# Patient Record
Sex: Female | Born: 2000 | Hispanic: No | Marital: Single | Smoking: Never smoker
Health system: Southern US, Community
[De-identification: ages and names within clinical notes are randomized; demographics above are authoritative.]

## PROBLEM LIST (undated history)

## (undated) ENCOUNTER — Emergency Department (HOSPITAL_BASED_OUTPATIENT_CLINIC_OR_DEPARTMENT_OTHER): Payer: Medicaid Other

## (undated) DIAGNOSIS — T7840XA Allergy, unspecified, initial encounter: Secondary | ICD-10-CM

## (undated) HISTORY — DX: Allergy, unspecified, initial encounter: T78.40XA

---
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
## 2022-01-19 ENCOUNTER — Ambulatory Visit
Admission: RE | Admit: 2022-01-19 | Discharge: 2022-01-19 | Disposition: A | Discharge status: Home / Self Care | Payer: Medicaid Other | Source: Ambulatory Visit | Attending: Physician Assistant | Admitting: Physician Assistant

## 2022-01-19 VITALS — BP 104/64 | HR 73 | Temp 98.1°F | Resp 16

## 2022-01-19 DIAGNOSIS — J209 Acute bronchitis, unspecified: Secondary | ICD-10-CM | POA: Diagnosis not present

## 2022-01-19 MED ORDER — PREDNISONE 20 MG PO TABS: 40.0000 mg | ORAL_TABLET | Freq: Every day | ORAL | 0 refills | Status: AC

## 2022-01-19 NOTE — ED Provider Notes (Signed)
[facility]    CSN: [id number] Arrival date & time: 01/19/22  9622      History   Chief Complaint Chief Complaint  Patient presents with   Cough    Entered by patient    HPI Anita Gutierrez is a 21 y.o. female.   Patient here today for evaluate of cough she has had the last 2 weeks that will not seem to go away.  She reports that cough is occasionally productive.  She has not had any fever.  She denies any nausea, vomiting or diarrhea.  She does not report treatment for symptoms.  The history is provided by the patient.  Cough Associated symptoms: no chills, no ear pain, no eye discharge, no fever, no shortness of breath, no sore throat and no wheezing     Past Medical History:  Diagnosis Date   Allergy     Patient Active Problem List   Diagnosis Date Noted   Irregular menses 01/28/2017   Alopecia 10/09/2016   Rhinitis, allergic 02/02/2015   Seasonal allergies 08/21/2012    History reviewed. No pertinent surgical history.  OB History   No obstetric history on file.      Home Medications    Prior to Admission medications   Medication Sig Start Date End Date Taking? Authorizing Provider  predniSONE (DELTASONE) 20 MG tablet Take 2 tablets (40 mg total) by mouth daily with breakfast for 5 days. 01/19/22 01/24/22 Yes Tomi Bamberger, PA-C  fluticasone (FLONASE) 50 MCG/ACT nasal spray Place 1 spray into both nostrils daily. Patient not taking: Reported on 12/14/2021 06/19/21   Avelino Leeds, DO    Family History History reviewed. No pertinent family history.  Social History Social History   Tobacco Use   Smoking status: Never   Smokeless tobacco: Never  Substance Use Topics   Alcohol use: Never   Drug use: Never     Allergies   Other   Review of Systems Review of Systems  Constitutional:  Negative for chills and fever.  HENT:  Negative for congestion, ear pain and sore throat.   Eyes:  Negative for discharge and redness.   Respiratory:  Positive for cough. Negative for shortness of breath and wheezing.   Gastrointestinal:  Negative for diarrhea, nausea and vomiting.     Physical Exam Triage Vital Signs ED Triage Vitals  Enc Vitals Group     BP      Pulse      Resp      Temp      Temp src      SpO2      Weight      Height      Head Circumference      Peak Flow      Pain Score      Pain Loc      Pain Edu?      Excl. in GC?    No data found.  Updated Vital Signs BP 104/64 (BP Location: Left Arm)   Pulse 73   Temp 98.1 F (36.7 C) (Oral)   Resp 16   LMP 01/12/2022   SpO2 99%      Physical Exam Vitals and nursing note reviewed.  Constitutional:      General: She is not in acute distress.    Appearance: Normal appearance. She is not ill-appearing.  HENT:     Head: Normocephalic and atraumatic.     Nose: Congestion present.  Eyes:     Conjunctiva/sclera: Conjunctivae normal.  Cardiovascular:     Rate and Rhythm: Normal rate and regular rhythm.     Heart sounds: Normal heart sounds. No murmur heard. Pulmonary:     Effort: Pulmonary effort is normal. No respiratory distress.     Breath sounds: Normal breath sounds. No wheezing, rhonchi or rales.  Skin:    General: Skin is warm and dry.  Neurological:     Mental Status: She is alert.  Psychiatric:        Mood and Affect: Mood normal.        Thought Content: Thought content normal.      UC Treatments / Results  Labs (all labs ordered are listed, but only abnormal results are displayed) Labs Reviewed - No data to display  EKG   Radiology No results found.  Procedures Procedures (including critical care time)  Medications Ordered in UC Medications - No data to display  Initial Impression / Assessment and Plan / UC Course  I have reviewed the triage vital signs and the nursing notes.  Pertinent labs & imaging results that were available during my care of the patient were reviewed by me and considered in my medical  decision making (see chart for details).    Steroid burst prescribed to cover bronchitis.  Recommended follow-up if no gradual improvement or with any further concerns.  Final Clinical Impressions(s) / UC Diagnoses   Final diagnoses:  Acute bronchitis, unspecified organism   Discharge Instructions   None    ED Prescriptions     Medication Sig Dispense Auth. Provider   predniSONE (DELTASONE) 20 MG tablet Take 2 tablets (40 mg total) by mouth daily with breakfast for 5 days. 10 tablet Francene Finders, PA-C      PDMP not reviewed this encounter.   Francene Finders, PA-C 01/19/22 1050

## 2022-01-19 NOTE — ED Triage Notes (Signed)
Pt c/o cough that won't go away.  Started: two weeks ago   Home interventions: none

## 2022-01-25 ENCOUNTER — Ambulatory Visit (INDEPENDENT_AMBULATORY_CARE_PROVIDER_SITE_OTHER): Payer: Medicaid Other | Admitting: Pediatrics

## 2022-01-25 ENCOUNTER — Encounter: Payer: Self-pay | Admitting: Pediatrics

## 2022-01-25 VITALS — Temp 97.7°F | Wt 138.6 lb

## 2022-01-25 DIAGNOSIS — M545 Low back pain, unspecified: Secondary | ICD-10-CM

## 2022-01-25 DIAGNOSIS — K5900 Constipation, unspecified: Secondary | ICD-10-CM

## 2022-01-25 LAB — POCT URINALYSIS DIPSTICK
Bilirubin, UA: NEGATIVE
Blood, UA: NEGATIVE
Glucose, UA: NEGATIVE
Ketones, UA: NEGATIVE
Leukocytes, UA: NEGATIVE
Nitrite, UA: NEGATIVE
Protein, UA: POSITIVE — AB
Spec Grav, UA: 1.01 (ref 1.010–1.025)
Urobilinogen, UA: NEGATIVE E.U./dL — AB
pH, UA: 7.5 (ref 5.0–8.0)

## 2022-01-25 MED ORDER — POLYETHYLENE GLYCOL 3350 17 GM/SCOOP PO POWD: 17.0000 g | Freq: Every day | ORAL | 3 refills | Status: DC

## 2022-01-25 NOTE — Patient Instructions (Addendum)
Thank you for coming in for your visit today. It's great to see you taking charge of your health and addressing your concerns. Based on our discussion, I have provided a list of instructions for you to follow:  1. Labs and Exams: Urine sample to check for any signs of infection. Our nurse will give you a cup to collect the sample.  2. Behavior Changes: Increase your fiber intake and continue drinking plenty of water. Be mindful of your bathroom habits and try not to suppress the urge to go.  3. Medication: I have prescribed Miralax (polyethylene glycol) as a stool softener. Take one capful once a day for the next two weeks. If your stool becomes too soft, reduce the dose to half a capful or take it once every other day.  4. Over-the-counter options: If needed, you can also try over-the-counter laxatives like Docalax to help with constipation.  5. Follow-ups: If your symptoms do not improve after two to three weeks of following the above instructions, please schedule a follow-up appointment with [place].  6. Handouts: Our nurse will provide you with information on managing constipation and preventing UTIs.  Once again, thank you for your visit, and congratulations on taking this important step for your health. If you have any questions or concerns, please do not hesitate to contact our office.  Sincerely,  Dr. [facility]

## 2022-01-25 NOTE — Progress Notes (Unsigned)
Subjective:    Anita Gutierrez is a 21 y.o. old female here for Back Pain (X1 weeks/In the morning when pt wokes up she has back pain) .    Interpreter present: None.    HPI  The patient presents with a chief complaint of back pain, particularly in the mornings, lasting for about two to three hours. The pain is described as deep and radiating around the lower back. The patient reports that the pain is severe, rating it as an 8 out of 10, but does not impede walking. The patient has been experiencing this issue for one week.  The patient has a history of urinary tract infection (UTI) approximately four to five months ago and is concerned about a possible recurrence due to the presence of discolored, brownish urine in the mornings. The patient denies any current sexual activity and reports a high water intake.  The patient also reports recent constipation, with a decrease in bowel movement frequency and hard stool texture. The patient has not taken any laxatives for this issue. The patient is currently attending college and recently started working at [employer]. The patient's last menstrual period was on the 28th, and they typically experience back pain during menstruation.  Patient Active Problem List   Diagnosis Date Noted   Irregular menses 01/28/2017   Alopecia 10/09/2016   Rhinitis, allergic 02/02/2015   Seasonal allergies 08/21/2012    PE up to date?:yes   History and Problem List: Anita Gutierrez has Seasonal allergies; Rhinitis, allergic; Alopecia; and Irregular menses on their problem list.  Earnest  has a past medical history of Allergy.  Immunizations needed: none     Objective:    Temp 97.7 F (36.5 C) (Oral)   Wt 138 lb 9.6 oz (62.9 kg)   LMP 01/12/2022   BMI 24.22 kg/m   Physical Exam Vitals reviewed.  Constitutional:      Appearance: Normal appearance.  Musculoskeletal:        General: No swelling or deformity. Normal range of motion.     Cervical back: Normal and normal  range of motion. No rigidity or tenderness.     Thoracic back: Normal. No swelling, tenderness or bony tenderness. Normal range of motion.     Lumbar back: Normal. No swelling, spasms, tenderness or bony tenderness. No scoliosis.  Neurological:     Mental Status: She is alert.   Results for orders placed or performed in visit on 01/25/22 (from the past 24 hour(s))  POCT urinalysis dipstick     Status: Abnormal   Collection Time: 01/25/22 12:04 PM  Result Value Ref Range   Color, UA yellow    Clarity, UA clear    Glucose, UA Negative Negative   Bilirubin, UA neg    Ketones, UA neg    Spec Grav, UA 1.010 1.010 - 1.025   Blood, UA neg    pH, UA 7.5 5.0 - 8.0   Protein, UA Positive (A) Negative   Urobilinogen, UA negative (A) 0.2 or 1.0 E.U./dL   Nitrite, UA neg    Leukocytes, UA Negative Negative   Appearance     Odor           Assessment and Plan:     Anita Gutierrez was seen today for Back Pain (X1 weeks/In the morning when pt wokes up she has back pain) .   Problem List Items Addressed This Visit   None Visit Diagnoses     Low back pain, unspecified back pain laterality, unspecified chronicity, unspecified whether  sciatica present    -  Primary   Relevant Orders   POCT urinalysis dipstick (Completed)   Constipation, unspecified constipation type            1. Lower back pain    No red flag symptoms and normal physical exam.  Possible muscle strain vs constipation.  Will monitor symptoms for the next week.     - negative Urinalysis, UTI unlikely and patient reassured.  Follow up in one week to repeat urinalysis to evaluate proteinuria and constipation      2. Constipation:    - Encouraged increased fiber intake and hydration.    - Prescribed Miralax (polyethylene glycol) for daily use, with a starting dose of one capful once a day. Adjust the dose as needed based on stool consistency.    - Recommended over-the-counter stool softeners such as Docalax if needed.    -  Reassess constipation and response to treatment in two to three weeks.  3. Stress management:    - Encourage the patient to address potential stressors related to college and work.    - Recommend relaxation techniques and stress management strategies, such as deep breathing exercises, mindfulness, and time management.  4. Follow-up:    - Schedule a follow-up appointment in two to three weeks to evaluate the patient's response to treatment and address any ongoing concerns.    - Instruct the patient to contact the clinic if symptoms worsen or new symptoms arise before the scheduled follow-up appointment.  Return in about 2 weeks (around 02/08/2022) for recheck urine, follow back pain and constipation .  Darrall Dears, MD

## 2022-02-15 ENCOUNTER — Ambulatory Visit (INDEPENDENT_AMBULATORY_CARE_PROVIDER_SITE_OTHER): Payer: Medicaid Other | Admitting: Pediatrics

## 2022-02-15 ENCOUNTER — Encounter: Payer: Self-pay | Admitting: Pediatrics

## 2022-02-15 VITALS — Temp 98.2°F | Wt 136.4 lb

## 2022-02-15 DIAGNOSIS — K5900 Constipation, unspecified: Secondary | ICD-10-CM

## 2022-02-15 DIAGNOSIS — Z09 Encounter for follow-up examination after completed treatment for conditions other than malignant neoplasm: Secondary | ICD-10-CM | POA: Diagnosis not present

## 2022-02-15 DIAGNOSIS — R808 Other proteinuria: Secondary | ICD-10-CM | POA: Diagnosis not present

## 2022-02-15 LAB — POCT URINALYSIS DIPSTICK
Bilirubin, UA: NEGATIVE
Blood, UA: 250
Glucose, UA: NEGATIVE
Ketones, UA: NEGATIVE
Leukocytes, UA: NEGATIVE
Nitrite, UA: NEGATIVE
Protein, UA: NEGATIVE
Spec Grav, UA: 1.01 (ref 1.010–1.025)
Urobilinogen, UA: NEGATIVE E.U./dL — AB
pH, UA: 8 (ref 5.0–8.0)

## 2022-02-15 NOTE — Progress Notes (Signed)
Subjective:    Anita Gutierrez is a 21 y.o. old female here for Follow-up .     HPI  The patient presents primarily to follow up on abnormal urine from previous visit. Concerned that she previously had protein in her urine, but states that she is not currently experiencing any symptoms related to this issue. The patient also reports a history of constipation, which has been an ongoing concern. She describes having watery stools while taking Miralax, but stopped taking it when her stools returned to normal. The patient admits to not taking the Miralax consistently, sometimes skipping days depending on how she felt.  The patient has a reported history of hemorrhoids, (was seen in ED for rectal bleeding in August last year) which were previously bloody but are now only painful. She describes a burning sensation during bowel movements, even when the stool is watery. The patient reports that her stools typically range from type 3 to 4 on the [facility] Stool Scale. She experienced painful bowel movements for a month, but the pain has not been consistent for the past week and a half. The patient's weight has remained stable, and she denies any current blood in her stool.  The patient has been sexually active 8-9 months ago, engaging in vaginal sex only, with no history of anal sex or sexually transmitted diseases. She reports that the area around her rectum is still sensitive.  Currently on menses.   Patient Active Problem List   Diagnosis Date Noted   Irregular menses 01/28/2017   Alopecia 10/09/2016   Rhinitis, allergic 02/02/2015   Seasonal allergies 08/21/2012    History and Problem List: Anita Gutierrez has Seasonal allergies; Rhinitis, allergic; Alopecia; and Irregular menses on their problem list.  Anita Gutierrez  has a past medical history of Allergy.     Objective:    Temp 98.2 F (36.8 C) (Oral)   Wt 136 lb 6.4 oz (61.9 kg)   LMP 01/12/2022   BMI 23.84 kg/m    General Appearance:   alert, oriented, no  acute distress  GI:    Normal appearance of anal area, no fissures, bleeding, external hemorrhoids.   Skin/Hair/Nails:   skin warm and dry; no bruises, no rashes, no lesions    Urinalysis    Component Value Date/Time   BILIRUBINUR neg 02/15/2022 1147   KETONESUR negative 06/05/2021 1430   PROTEINUR Negative 02/15/2022 1147   UROBILINOGEN negative (A) 02/15/2022 1147   NITRITE neg 02/15/2022 1147   LEUKOCYTESUR Negative 02/15/2022 1147        Assessment and Plan:     Anita Gutierrez was seen today for Follow-up .   Problem List Items Addressed This Visit   None Visit Diagnoses     Other proteinuria    -  Primary   Relevant Orders   POCT urinalysis dipstick (Completed)   Follow-up exam       Dyschezia           1. Proteinuria: - The patient's recent urine test showed no protein, indicating resolution of the previous proteinuria. No further action is needed at this time.  2. Constipation: - The patient reports varying stool consistency and occasional painful bowel movements. The patient has been using Miralax inconsistently. - Plan: Encourage the patient to increase dietary fiber intake through whole foods, such as fruits, vegetables, whole grains, and legumes. Advise the patient to use Miralax consistently, adjusting the dose as needed to maintain a soft stool consistency. Recommend drinking plenty of water and establishing regular bowel habits.  Reassess the patient's progress in a month to a month and a half, and consider referral to Gastroenterology if symptoms persist.  3. Anal Pain: - The patient has a history of rectal bleeding thought due to hemorrhoids, but the visualization of the anal area today showed no current fissures or external hemorrhoids. The patient reports only occasional pain and currently no blood in the stool. - Plan: Manage the patient's constipation as described above to reduce straining and prevent the recurrence of hemorrhoids. Monitor for any worsening of  symptoms or blood in the stool, and consider further evaluation if necessary specifically GI referral..   Return precautions reviewed. Severe rectal pain or bleeding.    Return if symptoms worsen or fail to improve.  Darrall Dears, MD

## 2022-02-22 ENCOUNTER — Encounter: Payer: Self-pay | Admitting: Pediatrics

## 2022-03-19 ENCOUNTER — Other Ambulatory Visit: Payer: Self-pay | Admitting: Pediatrics

## 2022-06-17 DIAGNOSIS — L308 Other specified dermatitis: Secondary | ICD-10-CM | POA: Diagnosis not present

## 2022-06-17 DIAGNOSIS — L639 Alopecia areata, unspecified: Secondary | ICD-10-CM | POA: Diagnosis not present

## 2022-07-29 DIAGNOSIS — L639 Alopecia areata, unspecified: Secondary | ICD-10-CM | POA: Diagnosis not present

## 2022-07-29 DIAGNOSIS — L700 Acne vulgaris: Secondary | ICD-10-CM | POA: Diagnosis not present

## 2022-08-08 ENCOUNTER — Other Ambulatory Visit: Payer: Self-pay

## 2022-08-08 ENCOUNTER — Ambulatory Visit (INDEPENDENT_AMBULATORY_CARE_PROVIDER_SITE_OTHER): Payer: Medicaid Other | Admitting: Pediatrics

## 2022-08-08 ENCOUNTER — Encounter: Payer: Self-pay | Admitting: Pediatrics

## 2022-08-08 VITALS — HR 87 | Temp 98.0°F | Wt 135.2 lb

## 2022-08-08 DIAGNOSIS — R350 Frequency of micturition: Secondary | ICD-10-CM | POA: Insufficient documentation

## 2022-08-08 LAB — POCT URINALYSIS DIPSTICK
Bilirubin, UA: NEGATIVE
Blood, UA: NEGATIVE
Glucose, UA: NEGATIVE
Ketones, UA: NEGATIVE
Nitrite, UA: NEGATIVE
Protein, UA: NEGATIVE
Spec Grav, UA: 1.02 (ref 1.010–1.025)
Urobilinogen, UA: 1 E.U./dL
pH, UA: 6 (ref 5.0–8.0)

## 2022-08-08 NOTE — Assessment & Plan Note (Addendum)
Well-appearing, vitals stable. POC UA not reflective of UTI. Discussed with patient that constipation may be playing a role. Pending results for wet prep and urine culture.   Further, given list of PCPs to contact for transition to adult care practice.

## 2022-08-08 NOTE — Progress Notes (Addendum)
  SUBJECTIVE:   CHIEF COMPLAINT / HPI:   Patient presents with odor in urine, darkened urine, urgency, frequency x ~2 weeks. She has had a UTI before but notes these symptoms are not as bad as her previous UTI. That was treated with Bactrim. Denies sexual activity in the last year. No vaginal discharge. She does get regular periods, LMP ~10 days ago. She does endorse regular difficulty with constipation because she does not enjoy taking Miralax.  Denies dysuria, fever, flank pain. Not taken any medications. Denies any different foods.   PERTINENT  PMH / PSH: allergies, allergic rhinitis  Patient Care Team: Jonetta Osgood, MD as PCP - General (Pediatrics) Jonetta Osgood, MD (Pediatrics) OBJECTIVE:  Pulse 87   Temp 98 F (36.7 C) (Oral)   Wt 135 lb 3.2 oz (61.3 kg)   SpO2 100%   BMI 23.63 kg/m  Physical Exam Constitutional:      General: She is not in acute distress.    Appearance: Normal appearance. She is not ill-appearing.  Cardiovascular:     Rate and Rhythm: Normal rate and regular rhythm.     Heart sounds: Normal heart sounds.  Abdominal:     General: Abdomen is flat.     Palpations: Abdomen is soft.     Comments: No suprapubic tenderness  Musculoskeletal:     Comments: No CVA tenderness  Neurological:     Mental Status: She is alert.    Results for orders placed or performed in visit on 08/08/22 (from the past 24 hour(s))  POCT urinalysis dipstick     Status: Abnormal   Collection Time: 08/08/22  3:04 PM  Result Value Ref Range   Color, UA dark yellow    Clarity, UA clear    Glucose, UA Negative Negative   Bilirubin, UA negative    Ketones, UA negative    Spec Grav, UA 1.020 1.010 - 1.025   Blood, UA negative    pH, UA 6.0 5.0 - 8.0   Protein, UA Negative Negative   Urobilinogen, UA 1.0 0.2 or 1.0 E.U./dL   Nitrite, UA negative    Leukocytes, UA Trace (A) Negative   Appearance     Odor      ASSESSMENT/PLAN:  Urinary frequency Assessment &  Plan: Well-appearing, vitals stable. POC UA not reflective of UTI. Discussed with patient that constipation may be playing a role. Pending results for wet prep and urine culture.   Further, given list of PCPs to contact for transition to adult care practice.   Orders: -     POCT urinalysis dipstick -     Urine Culture -     C. trachomatis/N. gonorrhoeae RNA -     WET PREP BY MOLECULAR PROBE  Return if symptoms worsen or fail to improve. Shelby Mattocks, DO 08/08/2022, 3:35 PM PGY-2

## 2022-08-08 NOTE — Patient Instructions (Addendum)
Constipation and tight clothing may cause this urinary urgency you have been experiencing. Your point of care testing was negative for UTI. We submitted a culture. We are also testing for STDs.  Please see below for the adult primary care clinics.    Adult Primary Care Clinics Name Criteria Services   [facility]  Address: [address] [place], [place] [address]  Phone: [phone] Hours: Monday - Friday 9 AM -6 PM  Types of insurance accepted:  Commercial insurance [place] [employer] (orange card) [employer] Uninsured  Language services:  Video and phone interpreters available   Ages 60 and older    Adult primary care Onsite pharmacy Integrated behavioral health Financial assistance counseling Walk-in hours for established patients  Financial assistance counseling hours: Tuesdays 2:00PM - 5:00PM  Thursday 8:30AM - 4:30PM  Space is limited, 10 on Tuesday and 20 on Thursday. It's on first come first serve basis  Name Criteria Services   [facility]  Address: [address] [place], [place] [address]  Phone: [phone]  Hours: Monday - Friday 8:30 AM - 5 PM  Types of insurance accepted:  Commercial insurance Medicaid Medicare Uninsured  Language services:  Video and phone interpreters available   All ages - newborn to adult   Primary care for all ages (children and adults) Integrated behavioral health Nutritionist Financial assistance counseling   Name Criteria Services   [facility]  Located on the ground floor of [facility]  Address: [address]  [place],  [place]  [address]  Phone: [phone]  Hours: Monday - Friday 8:15 AM - 5 PM  Types of insurance accepted:  Commercial insurance Medicaid Medicare Uninsured  Language services:  Video and phone interpreters available   Ages 62 and older   Adult primary  care Nutritionist Certified Diabetes Educator  Integrated behavioral health Financial assistance counseling   Name Criteria Services   [facility]  Address: [address] [place], [place] [address]  Phone: [phone]  Hours: Monday - Friday 8:30 AM - 5 PM    Types of insurance accepted:  Nurse, learning disability Medicaid Medicare Uninsured  Language services:  Video and phone interpreters available   All ages - newborn to adult   Primary care for all ages (children and adults) Integrated behavioral health Financial assistance counseling

## 2022-08-09 LAB — URINE CULTURE
MICRO NUMBER:: 14995688
Result:: NO GROWTH
SPECIMEN QUALITY:: ADEQUATE

## 2022-08-12 ENCOUNTER — Encounter: Payer: Self-pay | Admitting: Pediatrics

## 2022-08-12 LAB — C. TRACHOMATIS/N. GONORRHOEAE RNA

## 2022-08-12 LAB — WET PREP BY MOLECULAR PROBE
Candida species: NOT DETECTED
Gardnerella vaginalis: NOT DETECTED
MICRO NUMBER:: 14995689
SPECIMEN QUALITY:: ADEQUATE
Trichomonas vaginosis: NOT DETECTED

## 2022-08-26 ENCOUNTER — Encounter: Payer: Self-pay | Admitting: Family Medicine

## 2022-08-26 ENCOUNTER — Ambulatory Visit (INDEPENDENT_AMBULATORY_CARE_PROVIDER_SITE_OTHER): Payer: Medicaid Other | Admitting: Family Medicine

## 2022-08-26 VITALS — BP 105/61 | HR 70 | Ht 63.0 in | Wt 133.4 lb

## 2022-08-26 DIAGNOSIS — Z021 Encounter for pre-employment examination: Secondary | ICD-10-CM

## 2022-08-26 DIAGNOSIS — Z1159 Encounter for screening for other viral diseases: Secondary | ICD-10-CM

## 2022-08-26 DIAGNOSIS — Z23 Encounter for immunization: Secondary | ICD-10-CM

## 2022-08-26 DIAGNOSIS — Z0000 Encounter for general adult medical examination without abnormal findings: Secondary | ICD-10-CM

## 2022-08-26 DIAGNOSIS — Z114 Encounter for screening for human immunodeficiency virus [HIV]: Secondary | ICD-10-CM | POA: Diagnosis not present

## 2022-08-26 NOTE — Progress Notes (Signed)
New Patient Visit  HPI:  Patient presents today for a new patient appointment to establish general primary care.  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  Past Medical Hx:  - Environmental allergies  - Irregular menses  - Alopecia areata   Past Surgical Hx:  -None  Family Hx: updated in Epic  Social Hx:  - occupation: nursing school  - highest level of education: In college  - lives with: Mom dad sister brother  - tobacco: None  - alcohol: None  - drugs:None - Not sexually active  Health Maintenance:  -Pap indicated starting next year - No first-degree relative history of breast cancer, no mammogram indicated at this time.  PHYSICAL EXAM: BP 105/61   Pulse 70   Ht 5\' 3"  (1.6 m)   Wt 133 lb 6 oz (60.5 kg)   LMP 08/25/2022   SpO2 100%   BMI 23.63 kg/m  General: well appearing, in no acute distress CV: RRR, radial pulses equal and palpable, no BLE edema  Resp: Normal work of breathing on room air, CTAB Abd: Soft, non tender, non distended  Neuro: Alert & Oriented x 4    ASSESSMENT/PLAN:  Health maintenance:  -HIV, hep C screening test ordered -Tdap vaccine given  Encounter for health exam secondary to work/schooling  - Hep B surface antibody ordered - MMR IgG titer ordered - QuantiFERON gold ordered   FOLLOW UP: Follow up in 1 year for annual physical and Pap  Lockie Mola, MD [facility]

## 2022-08-26 NOTE — Patient Instructions (Signed)
It was wonderful to see you today.  Please bring ALL of your medications with you to every visit.   I will let you know the results of all your labs!   Please follow up in one year or as needed.   Thank you for choosing [facility].   Please call [phone] with any questions about today's appointment.   Lockie Mola, MD  Family Medicine

## 2022-08-27 LAB — QUANTIFERON-TB GOLD PLUS

## 2022-08-27 LAB — HIV ANTIBODY (ROUTINE TESTING W REFLEX): HIV Screen 4th Generation wRfx: NONREACTIVE

## 2022-08-29 LAB — HCV AB W REFLEX TO QUANT PCR: HCV Ab: NONREACTIVE

## 2022-08-29 LAB — QUANTIFERON-TB GOLD PLUS
QuantiFERON Mitogen Value: >10 IU/mL
QuantiFERON Nil Value: 0.01 IU/mL
QuantiFERON TB1 Ag Value: 0.01 IU/mL
QuantiFERON TB2 Ag Value: 0.01 IU/mL
QuantiFERON-TB Gold Plus: NEGATIVE

## 2022-08-29 LAB — MEASLES/MUMPS/RUBELLA IMMUNITY
MUMPS ABS, IGG: 58.6 AU/mL (ref >10.9)
RUBEOLA AB, IGG: 221 AU/mL (ref >16.4)
Rubella Antibodies, IGG: 3.99 index (ref >0.99)

## 2022-08-29 LAB — HCV INTERPRETATION

## 2022-08-29 LAB — HEPATITIS B CORE AB W/REFLEX: Hep B Core Total Ab: NEGATIVE

## 2022-09-10 ENCOUNTER — Ambulatory Visit (INDEPENDENT_AMBULATORY_CARE_PROVIDER_SITE_OTHER): Payer: Medicaid Other | Admitting: Family Medicine

## 2022-09-10 ENCOUNTER — Encounter: Payer: Self-pay | Admitting: Family Medicine

## 2022-09-10 VITALS — BP 109/71 | HR 67 | Wt 134.2 lb

## 2022-09-10 DIAGNOSIS — Z0000 Encounter for general adult medical examination without abnormal findings: Secondary | ICD-10-CM | POA: Diagnosis not present

## 2022-09-10 DIAGNOSIS — Z0184 Encounter for antibody response examination: Secondary | ICD-10-CM | POA: Diagnosis not present

## 2022-09-10 DIAGNOSIS — M549 Dorsalgia, unspecified: Secondary | ICD-10-CM

## 2022-09-10 NOTE — Patient Instructions (Signed)
It was nice seeing you today!  Blood work today.  See me in 3 months or whenever is a good for you.  Stay well, Collier Bohnet, MD [facility] [phone]  --  Make sure to check out at the front desk before you leave today.  Please arrive at least 15 minutes prior to your scheduled appointments.  If you had blood work today, I will send you a MyChart message or a letter if results are normal. Otherwise, I will give you a call.  If you had a referral placed, they will call you to set up an appointment. Please give us a call if you don't hear back in the next 2 weeks.  If you need additional refills before your next appointment, please call your pharmacy first.  

## 2022-09-10 NOTE — Progress Notes (Signed)
    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 She needs varicella titers as part of her school forms.  Patient reports that she will on occasion have back discomfort, sometimes feels as if her back is misaligned. She reports this is occasional, minimally bothersome.  Denies smoking, alcohol, recreational drug use. She exercises 2 days a week, 1 to 2 hours at a time doing weights, walking, and running. She lives at home with mother, father, younger brother and younger sister.  PERTINENT  PMH / PSH:   Patient Care Team: Lockie Mola, MD as PCP - General (Family Medicine) Jonetta Osgood, MD (Pediatrics)   OBJECTIVE:   BP 109/71   Pulse 67   Wt 134 lb 4 oz (60.9 kg)   LMP 08/27/2022   SpO2 100%   BMI 23.78 kg/m   Physical Exam Constitutional:      General: She is not in acute distress. HENT:     Head: Normocephalic and atraumatic.     Mouth/Throat:     Mouth: Mucous membranes are moist.     Pharynx: Oropharynx is clear. No oropharyngeal exudate or posterior oropharyngeal erythema.  Eyes:     Extraocular Movements: Extraocular movements intact.     Pupils: Pupils are equal, round, and reactive to light.  Cardiovascular:     Rate and Rhythm: Normal rate and regular rhythm.  Pulmonary:     Effort: Pulmonary effort is normal. No respiratory distress.     Breath sounds: Normal breath sounds.  Abdominal:     Palpations: Abdomen is soft.     Tenderness: There is no abdominal tenderness.  Musculoskeletal:     Cervical back: Neck supple.  Lymphadenopathy:     Cervical: No cervical adenopathy.  Skin:    General: Skin is warm and dry.  Neurological:     Mental Status: She is alert.         09/10/2022    2:38 PM  Depression screen PHQ 2/9  Decreased Interest 0  Down,  Depressed, Hopeless 0  PHQ - 2 Score 0  Altered sleeping 0  Tired, decreased energy 0  Change in appetite 0  Feeling bad or failure about yourself  0  Trouble concentrating 0  Moving slowly or fidgety/restless 0  Suicidal thoughts 0  PHQ-9 Score 0     {Show previous vital signs (optional):23777}    ASSESSMENT/PLAN:   1. Encounter for routine history and physical examination of adult Physical exam form completed and handed to patient during encounter.  Health maintenance handout provided.  2. Immunity to varicella determined by serologic test Obtain varicella titers as required by her school. - Varicella zoster antibody, IgG    3. Discomfort of back Intermittent, seems to be minimally bothersome.  Advised that she can take acetaminophen and/or ibuprofen if pain becomes severe enough.  Discussed exercise and core strengthening.  Advised to follow-up if this becomes a significant problem.  Return in about 1 year (around 09/10/2023) for physical.   Littie Deeds, MD [facility] Medicine [facility]

## 2022-09-12 LAB — VARICELLA ZOSTER ANTIBODY, IGG: Varicella zoster IgG: 173 index (ref >165)

## 2022-09-16 DIAGNOSIS — L639 Alopecia areata, unspecified: Secondary | ICD-10-CM | POA: Diagnosis not present

## 2022-09-25 ENCOUNTER — Ambulatory Visit: Payer: Medicaid Other | Admitting: Pediatrics

## 2022-10-28 DIAGNOSIS — L639 Alopecia areata, unspecified: Secondary | ICD-10-CM | POA: Diagnosis not present

## 2022-12-11 DIAGNOSIS — L639 Alopecia areata, unspecified: Secondary | ICD-10-CM | POA: Diagnosis not present

## 2023-01-29 DIAGNOSIS — L639 Alopecia areata, unspecified: Secondary | ICD-10-CM | POA: Diagnosis not present

## 2023-02-19 ENCOUNTER — Ambulatory Visit: Payer: Medicaid Other | Admitting: Family Medicine

## 2023-02-19 ENCOUNTER — Ambulatory Visit (INDEPENDENT_AMBULATORY_CARE_PROVIDER_SITE_OTHER): Payer: Medicaid Other | Admitting: Family Medicine

## 2023-02-19 VITALS — BP 117/82 | HR 83 | Ht 63.0 in | Wt 135.4 lb

## 2023-02-19 DIAGNOSIS — I7300 Raynaud's syndrome without gangrene: Secondary | ICD-10-CM | POA: Insufficient documentation

## 2023-02-19 NOTE — Progress Notes (Signed)
    SUBJECTIVE:   CHIEF COMPLAINT / HPI: feet numbness  Toe feels numb every day, intermittently. Notices it with colder weather. Takes up to 7min-hour to go way with heating her feet up. Worse with weather recently. Denies fevers, rashes, joint pain. Does not smoke.   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 No symptoms to suggest secondary autoimmune derangement causing symptoms. Counseled on keeping extremity digits warm. Counseled on return precautions for autoimmune symptoms or prolonged symptom course.  Return if symptoms worsen or fail to improve.  Celine Mans, MD [facility]

## 2023-02-19 NOTE — Assessment & Plan Note (Signed)
Likely primary Raynaud's phenomena given age and clinical course. No symptoms to suggest secondary autoimmune derangement causing symptoms. Counseled on keeping extremity digits warm. Counseled on return precautions for autoimmune symptoms or prolonged symptom course.

## 2023-02-19 NOTE — Patient Instructions (Addendum)
It was great to see you! Thank you for allowing me to participate in your care!  Our plans for today:  - You have Raynaud's phenomena, this causes your cold feed and bluetinged feet. Please make sure to keep your feet warm as we discussed.  - If you develop new symptoms such as joint pain, fevers, muscle cramps please make a follow-up appointment.   Please arrive 15 minutes PRIOR to your next scheduled appointment time! If you do not, this affects OTHER patients' care.  Take care and seek immediate care sooner if you develop any concerns.   Celine Mans, MD, PGY-2 [facility] 10:58 AM 02/19/2023  [facility] Family [facility]

## 2023-04-03 ENCOUNTER — Other Ambulatory Visit (HOSPITAL_COMMUNITY)
Admission: RE | Admit: 2023-04-03 | Discharge: 2023-04-03 | Disposition: A | Discharge status: Home / Self Care | Payer: Medicaid Other | Source: Ambulatory Visit | Attending: Family Medicine | Admitting: Family Medicine

## 2023-04-03 ENCOUNTER — Ambulatory Visit (INDEPENDENT_AMBULATORY_CARE_PROVIDER_SITE_OTHER): Payer: Medicaid Other | Admitting: Family Medicine

## 2023-04-03 ENCOUNTER — Encounter: Payer: Self-pay | Admitting: Family Medicine

## 2023-04-03 VITALS — BP 126/84 | HR 83 | Ht 63.0 in | Wt 141.2 lb

## 2023-04-03 DIAGNOSIS — B3731 Acute candidiasis of vulva and vagina: Secondary | ICD-10-CM | POA: Diagnosis not present

## 2023-04-03 DIAGNOSIS — N898 Other specified noninflammatory disorders of vagina: Secondary | ICD-10-CM

## 2023-04-03 LAB — POCT WET PREP (WET MOUNT)
Clue Cells Wet Prep Whiff POC: NEGATIVE
Trichomonas Wet Prep HPF POC: ABSENT

## 2023-04-03 MED ORDER — FLUCONAZOLE 150 MG PO TABS
150.0000 mg | ORAL_TABLET | Freq: Once | ORAL | 0 refills | Status: AC
Start: 2023-04-03 — End: 2023-04-03

## 2023-04-03 NOTE — Progress Notes (Signed)
    SUBJECTIVE:   CHIEF COMPLAINT / HPI:   Vaginal itching Ongoing for the past 4 days. Denies vaginal discharge. Recently shaved with a razor and uses shaving cream that may have irritated the area. Abstinent, low concern for STI and not interested in birth control.  PERTINENT  PMH / PSH: Allergies  OBJECTIVE:   BP 126/84   Pulse 83   Ht 5\' 3"  (1.6 m)   Wt 141 lb 3.2 oz (64 kg)   SpO2 100%   BMI 25.01 kg/m    General: NAD, pleasant, able to participate in exam Pelvic exam: VULVA: normal appearing vulva with no masses, tenderness or lesions, Mild folliculitis, VAGINA: normal appearing vagina with normal color and discharge, no lesions, vaginal discharge - white, WET MOUNT done - results: KOH done, hyphae, exam chaperoned by [place], CMA.  ASSESSMENT/PLAN:   Assessment & Plan Vaginal itching Wet prep suggestive of yeast infection, treatment sent. Swabbed for STIs as well but low risk given celibate. Likely component of folliculitis as well, advised on shaving/irritants. -Fluconazole x1 -G/C and trich       Dr. Elberta Fortis, DO [facility]

## 2023-04-03 NOTE — Patient Instructions (Signed)
It was wonderful to see you today! Thank you for choosing [facility].   Please bring ALL of your medications with you to every visit.   Today we talked about:  We did swabs today for bacterial and yeast vaginal infections.  I also swabbed you for gonorrhea, chlamydia and trichomonas and follow-up with all of those results.  I would recommend avoiding scented products on that area and only wash gently with mild soap and water.  Never place anything inside the vagina.  I would avoid close shaving the next 1 to 2 weeks as well as this can irritate the area further.  In the future he may consider using an Neurosurgeon with a guard so as not to irritate hair follicles with shaving with a razor.  Please follow up as needed for persistent symptoms   We are checking some labs today. If they are abnormal, I will call you. If they are normal, I will send you a MyChart message (if it is active) or a letter in the mail. If you do not hear about your labs in the next 2 weeks, please call the office.  Call the clinic at [phone] if your symptoms worsen or you have any concerns.  Please be sure to schedule follow up at the front desk before you leave today.   Anita Fortis, DO Family Medicine

## 2023-04-04 ENCOUNTER — Encounter: Payer: Self-pay | Admitting: Family Medicine

## 2023-04-04 LAB — CERVICOVAGINAL ANCILLARY ONLY
Chlamydia: NEGATIVE
Comment: NEGATIVE
Comment: NEGATIVE
Comment: NORMAL
Neisseria Gonorrhea: NEGATIVE
Trichomonas: NEGATIVE

## 2023-04-08 ENCOUNTER — Telehealth: Payer: Self-pay

## 2023-04-08 DIAGNOSIS — B3731 Acute candidiasis of vulva and vagina: Secondary | ICD-10-CM

## 2023-04-08 MED ORDER — FLUCONAZOLE 150 MG PO TABS
150.0000 mg | ORAL_TABLET | Freq: Once | ORAL | 0 refills | Status: AC
Start: 2023-04-08 — End: 2023-04-08

## 2023-04-08 NOTE — Telephone Encounter (Signed)
Patient calls nurse line regarding last week's visit. She reports that symptoms have improved, however, reports slight vaginal discomfort/itching and small amount of white discharge.   She had fluconazole x1.   She is asking if she needs to receive another dose to completley treat the yeast infection.   Will forward to Dr. Ardyth Harps.   Veronda Prude, RN

## 2023-05-02 ENCOUNTER — Ambulatory Visit: Payer: Medicaid Other | Admitting: Student

## 2023-05-02 ENCOUNTER — Encounter: Payer: Self-pay | Admitting: Student

## 2023-05-02 VITALS — BP 134/84 | HR 88 | Ht 63.0 in | Wt 139.0 lb

## 2023-05-02 DIAGNOSIS — K649 Unspecified hemorrhoids: Secondary | ICD-10-CM

## 2023-05-02 DIAGNOSIS — K5900 Constipation, unspecified: Secondary | ICD-10-CM

## 2023-05-02 MED ORDER — POLYETHYLENE GLYCOL 3350 17 GM/SCOOP PO POWD: 8.5000 g | Freq: Every day | ORAL | 3 refills | Status: DC

## 2023-05-02 MED ORDER — HYDROCORTISONE (PERIANAL) 2.5 % EX CREA: 1.0000 | TOPICAL_CREAM | Freq: Two times a day (BID) | CUTANEOUS | 2 refills | Status: AC

## 2023-05-02 NOTE — Patient Instructions (Addendum)
Kaleiyah,  Let's start with Miralax 1/2 cap/day. You may need to adjust this dose up or down until you get to 1-2 soft BM/day. You can try am dosing or PM dosing.   If you feel like your anxiety and constipation are getting worse and running together, this is another potential treatment course.   Eliezer Mccoy, MD

## 2023-05-02 NOTE — Progress Notes (Signed)
    SUBJECTIVE:   CHIEF COMPLAINT / HPI:   Constipation Has a longstanding history of constipation.  Previously followed at the [facility] for this.  She tells me that she has regular bowel movements nearly every day but that they are hard and sometimes painful to pass.  She also finds herself often straining and does have hemorrhoids related to this.   She was previously taking MiraLAX, 1 capful per day but found that it worked "too fast" and felt more like a laxative that led to diarrhea. Long discussion today also about the gut-brain axis and potential of some anxiety driving IBS-like symptoms.  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  She does not feel the need to pursue treatment of this anxiety at this point.   OBJECTIVE:   BP 134/84   Pulse 88   Ht 5\' 3"  (1.6 m)   Wt 139 lb (63 kg)   LMP 04/18/2023   SpO2 100%   BMI 24.62 kg/m   General: alert & oriented, no apparent distress, well groomed HEENT: normocephalic, atraumatic, EOM grossly intact, oral mucosa moist, neck supple Respiratory: normal respiratory effort GI: non-distended Skin: no rashes, no jaundice Psych: appropriate mood and affect   ASSESSMENT/PLAN:   Assessment & Plan Constipation, unspecified constipation type Chronic, does have daily bowel movements, though they are hard.  Would benefit from daily stool softener. -Restart MiraLAX, advised her to start at a half a cap per day and titrate to 1-2 soft bowel movements daily Hemorrhoids, unspecified hemorrhoid type -Anusol twice daily as needed for symptoms    J Dorothyann Gibbs, MD [facility] Medicine Center

## 2023-05-02 NOTE — Patient Instructions (Incomplete)
It was great to see you! Thank you for allowing me to participate in your care!  I recommend that you always bring your medications to each appointment as this makes it easy to ensure we are on the correct medications and helps [place] not miss when refills are needed.  Our plans for today:  - Constipation - Loss of appetite  We are checking some labs today, I will call you if they are abnormal will send you a MyChart message or a letter if they are normal.  If you do not hear about your labs in the next 2 weeks please let us know.***  Take care and seek immediate care sooner if you develop any concerns.   Dr. Bess Kinds, MD [facility] Medicine

## 2023-05-02 NOTE — Progress Notes (Deleted)
  SUBJECTIVE:   CHIEF COMPLAINT / HPI:   Constipation and loss of appetite  PERTINENT  PMH / PSH: ***  Past Medical History:  Diagnosis Date   Allergy    OBJECTIVE:  There were no vitals taken for this visit. Physical Exam   ASSESSMENT/PLAN:   Assessment & Plan  No follow-ups on file. Bess Kinds, MD 05/02/2023, 6:43 AM PGY-***, [facility] {    This will disappear when note is signed, click to select method of visit    :1}

## 2023-05-23 DIAGNOSIS — L639 Alopecia areata, unspecified: Secondary | ICD-10-CM | POA: Diagnosis not present

## 2023-05-23 DIAGNOSIS — L309 Dermatitis, unspecified: Secondary | ICD-10-CM | POA: Diagnosis not present

## 2023-05-28 ENCOUNTER — Encounter: Payer: Self-pay | Admitting: Student

## 2023-05-28 DIAGNOSIS — K5900 Constipation, unspecified: Secondary | ICD-10-CM

## 2023-05-29 MED ORDER — POLYETHYLENE GLYCOL 3350 17 GM/SCOOP PO POWD: 17.0000 g | Freq: Every day | ORAL | 3 refills | Status: AC

## 2023-07-01 ENCOUNTER — Ambulatory Visit (HOSPITAL_COMMUNITY)

## 2023-07-16 DIAGNOSIS — L639 Alopecia areata, unspecified: Secondary | ICD-10-CM | POA: Diagnosis not present

## 2023-07-16 DIAGNOSIS — L309 Dermatitis, unspecified: Secondary | ICD-10-CM | POA: Diagnosis not present

## 2023-08-08 ENCOUNTER — Telehealth: Payer: Self-pay

## 2023-08-08 NOTE — Telephone Encounter (Signed)
 Spoke with patient and was able to schedule her a nurse visit due to her form requesting vision and hearing to be completed. Patient explained that she needs this form completed by next Friday. Forwarding this message to PCP since I am not sure who is covering her box. Alain Howard CMA   Marking as a high priority since patient is worried to have this completed before the deadline

## 2023-08-08 NOTE — Telephone Encounter (Signed)
 Patient dropped off physical form needed for school. Patient stated she is needing it by at least next Friday. Placed in red folder.

## 2023-08-12 ENCOUNTER — Ambulatory Visit (INDEPENDENT_AMBULATORY_CARE_PROVIDER_SITE_OTHER)

## 2023-08-12 DIAGNOSIS — Z0110 Encounter for examination of ears and hearing without abnormal findings: Secondary | ICD-10-CM

## 2023-08-12 DIAGNOSIS — Z0100 Encounter for examination of eyes and vision without abnormal findings: Secondary | ICD-10-CM

## 2023-08-12 NOTE — Telephone Encounter (Signed)
 Patient called and informed that forms are ready for pick up. Copy made and placed in batch scanning. Original placed at front desk for pick up.   Veronda Prude, RN

## 2023-08-12 NOTE — Telephone Encounter (Signed)
 Hearing and Vision Screen PASS.  Unsure where the form is to record.   See nursing visit note for additional details.

## 2023-08-12 NOTE — Telephone Encounter (Signed)
 Ok, great! Once your done just forward the message to the RN team and they will call the patient when it is ready for pickup. Thanks! Alain Howard CMA

## 2023-08-12 NOTE — Progress Notes (Signed)
 Patient presents to nurse clinic for hearing and vision screen.  Hearing Screen: Pass Vision Screen: Pass   See admin for details.   She reports her school form needs to be handed in by 5/30. Will forward to covering provider for PCP.

## 2023-09-15 DIAGNOSIS — L639 Alopecia areata, unspecified: Secondary | ICD-10-CM | POA: Diagnosis not present

## 2023-09-16 ENCOUNTER — Encounter: Payer: Self-pay | Admitting: Family Medicine

## 2023-11-07 ENCOUNTER — Ambulatory Visit: Admitting: Family Medicine

## 2023-11-07 ENCOUNTER — Encounter: Payer: Self-pay | Admitting: Family Medicine

## 2023-11-07 VITALS — BP 117/77 | HR 82 | Ht 63.0 in | Wt 136.6 lb

## 2023-11-07 DIAGNOSIS — K5900 Constipation, unspecified: Secondary | ICD-10-CM | POA: Diagnosis not present

## 2023-11-07 DIAGNOSIS — R238 Other skin changes: Secondary | ICD-10-CM

## 2023-11-07 DIAGNOSIS — Z111 Encounter for screening for respiratory tuberculosis: Secondary | ICD-10-CM | POA: Diagnosis not present

## 2023-11-07 NOTE — Progress Notes (Signed)
 SUBJECTIVE:   CHIEF COMPLAINT / HPI:   F/u constipation  Patient has had constipation for 2 years. Mentions that she has tried many interventions and is very frustrated as feels like none of them have helped and she is not getting better.  Was previously on MiraLAX  as recommended by Dr. Marlee.  Patient said that MiraLAX  makes her stomach bubble and gives her diarrhea multiple times a day after just having half a cup a day.  She has tried to alternate when she takes it however said that side effects made it unbearable.  Recently she started taking fiber capsules.  However says that she still gets very constipated.  Has a bowel movement once every couple days.  Bowel movement does not resolve symptoms.  She reports fullness, sometimes can only eat one meal a day.  Has regular periods without dysmenorrhea.  Has bright red blood in stool occasionally; however, also has hemorrhoids.  No unintentional weight loss, night sweats.  No family history of colon cancer. Uses anusol  occasionally as she tries to avoid getting it close to her vagina to avoid yeast infection.    Skin irritation  Patient reports skin dryness and irritation in her inner thighs.  Patient has been using aquafor which helps. Does not matter what she wears. She says it turns very white if it doesn't moisturize immediately after showering.  Does not have dry skin otherwise. No history of eczema.  Denies ever having white patches on her skin otherwise.  Does not use any other creams other than Aquaphor in this area.  She also wonders if irritation could be due to wearing a pad as she feels like it is in this area as well.  Denies wearing pads with fragrance in them.  Patient wears only cotton underwear.  PERTINENT  PMH / PSH: constipation, hemorrhoids   OBJECTIVE:   BP 117/77   Pulse 82   Ht 5' 3 (1.6 m)   Wt 136 lb 9.6 oz (62 kg)   LMP 11/05/2023   SpO2 100%   BMI 24.20 kg/m   General: well appearing, in no acute  distress CV: Well-perfused appearing Resp: Normal work of breathing on room air Abd: Soft, non tender, non distended, normal bowel sounds Skin: Inguinal creases slightly erythematous and hyperpigmented, no white patches, no open wounds.   ASSESSMENT/PLAN:   Assessment & Plan Constipation, unspecified constipation type Constipation most likely functional/ idiopathic in nature.  No alarm signs for IBD, symptoms of endometriosis.  Does not take medications that could cause constipation as a side effect.  Does have bright red bleeding however this seems to be due to hemorrhoids compared to true rectal bleeding.  Patient would like a gastroenterology referral and would not like to try any other medications before having this evaluation at this time. -Referral to gastroenterology - Continue fiber, counseled patient regarding slow increase of fiber as did not worsen constipation, continue physical activity to help with gut mobility - Recommended sitz bath's for hemorrhoid if patient does not want to use Anusol  frequently. Skin irritation Patient skin irritation seems to be due to either contact dermatitis or irritation from moisture and sweat in the area.  No visible white patches at this time.  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 - Advised patient to try other menstrual products such as menstrual underwear - Continue using Aquaphor can try barrier cream such as a zinc cream or diaper rash cream - Follow-up if does not  improve, patient can also choose to follow-up with her dermatologist that she sees     Areta Saliva, MD [facility]

## 2023-11-07 NOTE — Patient Instructions (Signed)
 It was wonderful to see you today.  Please bring ALL of your medications with you to every visit.   Today we talked about:  Constipation - Continue taking fiber capsules. If it is making it worse ease back and then slowly increase fiber capsules. Continue doing sitz baths and using the anusol  as needed for the hemorrhoids. I have placed the GI referral. Please let us  know if you dont get a phone call in the next 2 weeks or so.   For your skin irritation - I believe this is skin irritation either from your pads or from sweating leading to irritation. You can try a zinc barrier cream or baby diaper cream. Please let us  know if it does not improve or make a follow up with your dermatologist.   Please follow up for your pap smear   Thank you for choosing [facility].   Please call [phone] with any questions about today's appointment.  Please be sure to schedule follow up at the front desk before you leave today.   Areta Saliva, MD  Family Medicine

## 2023-11-09 DIAGNOSIS — K5900 Constipation, unspecified: Secondary | ICD-10-CM | POA: Insufficient documentation

## 2023-11-09 NOTE — Assessment & Plan Note (Signed)
 Constipation most likely functional/ idiopathic in nature.  No alarm signs for IBD, symptoms of endometriosis.  Does not take medications that could cause constipation as a side effect.  Does have bright red bleeding however this seems to be due to hemorrhoids compared to true rectal bleeding.  Patient would like a gastroenterology referral and would not like to try any other medications before having this evaluation at this time. -Referral to gastroenterology - Continue fiber, counseled patient regarding slow increase of fiber as did not worsen constipation, continue physical activity to help with gut mobility - Recommended sitz bath's for hemorrhoid if patient does not want to use Anusol  frequently.

## 2023-11-11 ENCOUNTER — Ambulatory Visit: Payer: Self-pay | Admitting: Family Medicine

## 2023-11-11 LAB — QUANTIFERON-TB GOLD PLUS
QuantiFERON Mitogen Value: >10 [IU]/mL
QuantiFERON Nil Value: 0.04 [IU]/mL
QuantiFERON TB1 Ag Value: 0.04 [IU]/mL
QuantiFERON TB2 Ag Value: 0.04 [IU]/mL

## 2023-11-18 ENCOUNTER — Encounter: Payer: Self-pay | Admitting: Family Medicine

## 2023-11-19 DIAGNOSIS — L639 Alopecia areata, unspecified: Secondary | ICD-10-CM | POA: Diagnosis not present

## 2023-11-21 ENCOUNTER — Other Ambulatory Visit: Payer: Self-pay | Admitting: Family Medicine

## 2023-11-21 ENCOUNTER — Telehealth: Payer: Self-pay | Admitting: Family Medicine

## 2023-11-21 DIAGNOSIS — K5900 Constipation, unspecified: Secondary | ICD-10-CM

## 2023-11-21 MED ORDER — LUBIPROSTONE 8 MCG PO CAPS: 8.0000 ug | ORAL_CAPSULE | Freq: Two times a day (BID) | ORAL | 0 refills | Status: DC

## 2023-11-21 NOTE — Telephone Encounter (Signed)
 Called patient regarding mychart message. Discussed that symptoms do not sound alarming at this time for need to be seen urgently, but definitely is having troublesome symptoms. Patient says she is still having bowel movements and is not vomiting, but is having stomach cramping and constipation still.   Referral to GI is still pending.   Discussed that I would send her a medication that could help with the cramping until she sees GI.

## 2023-11-26 DIAGNOSIS — L03012 Cellulitis of left finger: Secondary | ICD-10-CM | POA: Diagnosis not present

## 2024-01-26 DIAGNOSIS — L639 Alopecia areata, unspecified: Secondary | ICD-10-CM | POA: Diagnosis not present

## 2024-01-26 DIAGNOSIS — L305 Pityriasis alba: Secondary | ICD-10-CM | POA: Diagnosis not present

## 2024-02-03 ENCOUNTER — Encounter: Payer: Self-pay | Admitting: Internal Medicine

## 2024-03-22 ENCOUNTER — Ambulatory Visit: Admitting: Internal Medicine
